# Patient Record
Sex: Male | Born: 1985 | Race: White | Hispanic: No | Marital: Single | Smoking: Never smoker
Health system: Southern US, Community
[De-identification: ages and names within clinical notes are randomized; demographics above are authoritative.]

## PROBLEM LIST (undated history)

## (undated) DIAGNOSIS — R030 Elevated blood-pressure reading, without diagnosis of hypertension: Secondary | ICD-10-CM

## (undated) DIAGNOSIS — J302 Other seasonal allergic rhinitis: Principal | ICD-10-CM

## (undated) HISTORY — DX: Elevated blood-pressure reading, without diagnosis of hypertension: R03.0

## (undated) HISTORY — PX: WISDOM TOOTH EXTRACTION: SHX21

## (undated) HISTORY — DX: Other seasonal allergic rhinitis: J30.2

---

## 2008-03-09 ENCOUNTER — Encounter: Admission: RE | Admit: 2008-03-09 | Discharge: 2008-03-09 | Payer: Self-pay | Admitting: Orthopedic Surgery

## 2011-07-17 ENCOUNTER — Emergency Department
Admit: 2011-07-17 | Discharge: 2011-07-17 | Disposition: A | Discharge status: Home / Self Care | Payer: BC Managed Care – PPO

## 2011-07-17 ENCOUNTER — Emergency Department
Admission: EM | Admit: 2011-07-17 | Discharge: 2011-07-17 | Disposition: A | Discharge status: Home / Self Care | Payer: BC Managed Care – PPO | Source: Home / Self Care | Attending: Family Medicine | Admitting: Family Medicine

## 2011-07-17 ENCOUNTER — Encounter: Payer: Self-pay | Admitting: Emergency Medicine

## 2011-07-17 DIAGNOSIS — S2341XA Sprain of ribs, initial encounter: Secondary | ICD-10-CM

## 2011-07-17 DIAGNOSIS — S299XXA Unspecified injury of thorax, initial encounter: Secondary | ICD-10-CM

## 2011-07-17 MED ORDER — TRAMADOL HCL 50 MG PO TABS: 50.0000 mg | ORAL_TABLET | Freq: Every evening | ORAL | Status: AC | PRN

## 2011-07-17 NOTE — ED Provider Notes (Signed)
History     CSN: [id number] Arrival date & time: 07/17/2011 12:48 PM   First MD Initiated Contact with Patient 07/17/11 1321      Chief Complaint  Patient presents with  . Rib Injury    (Consider location/radiation/quality/duration/timing/severity/associated sxs/prior treatment) HPI Comments: Patient was playing soccer two weeks ago when another player kneed him in the right lateral chest.  He felt a popping sensation and has had persistent pain in right lateral chest with movement and inspiration.  No shortness of breath    History reviewed. No pertinent past medical history.  History reviewed. No pertinent past surgical history.  History reviewed. No pertinent family history.  History  Substance Use Topics  . Smoking status: Never Smoker   . Smokeless tobacco: Not on file  . Alcohol Use: Yes      Review of Systems  Respiratory: Negative for shortness of breath and wheezing.     Allergies  Sulfate  Home Medications   Current Outpatient Rx  Name Route Sig Dispense Refill  . CETIRIZINE HCL 10 MG PO TABS Oral Take 10 mg by mouth daily.      . TRAMADOL HCL 50 MG PO TABS Oral Take 1 tablet (50 mg total) by mouth at bedtime as needed for pain. Maximum dose= 8 tablets per day 15 tablet 0    BP 168/92  Pulse 70  Temp(Src) 98.4 F (36.9 C) (Oral)  Resp 16  Ht 6\' 1"  (1.854 m)  Wt 208 lb (94.348 kg)  BMI 27.44 kg/m2  SpO2 99%  Physical Exam  Nursing note and vitals reviewed. Constitutional: He appears well-developed and well-nourished. No distress.  Neck: Normal range of motion.  Cardiovascular: Normal rate and normal heart sounds.   Pulmonary/Chest: No respiratory distress. He has no wheezes. He has no rales. He exhibits tenderness.  Abdominal: Soft. There is no tenderness.  Musculoskeletal:       Arms:      Tenderness over right lateral anterior inferior ribs.  No swelling, ecchymosis or crepitance    ED Course  Procedures  none   DG Ribs Unilateral  W/Chest Right (Final result)   Result time:07/17/11 1432    Final result by Rad Results In Interface (07/17/11 14:32:53)    Narrative:   *RADIOLOGY REPORT*  Clinical Data: Contusion to right lateral chest soccer 2 weeks ago. Tenderness right lateral ribs.  RIGHT RIBS AND CHEST - 3+ VIEW  Comparison: None.  Findings: Frontal view the chest demonstrates midline trachea. Normal heart size and mediastinal contours. No pleural effusion or pneumothorax.  Clear lungs.  2 views of right-sided ribs demonstrate a radiographic marker over the 11th posterolateral right rib. No displaced fracture.  IMPRESSION: No displaced rib fracture or pneumothorax.  Original Report Authenticated By: Consuello Bossier, M.D.      1. Rib injury       MDM  Rib belt applied.  Wear as needed.  Analgesic Rx for bedtime (tramadol) ([facility] information and instruction handout given)  Followup with [facility] if not improving about two weeks.         Donna Christen, MD 07/17/11 1444

## 2011-07-17 NOTE — ED Notes (Signed)
Rt rib injury 2 weeks ago in a soccer game

## 2013-01-10 ENCOUNTER — Ambulatory Visit (INDEPENDENT_AMBULATORY_CARE_PROVIDER_SITE_OTHER): Payer: BC Managed Care – PPO | Admitting: Family Medicine

## 2013-01-10 ENCOUNTER — Encounter: Payer: Self-pay | Admitting: Family Medicine

## 2013-01-10 VITALS — BP 151/95 | HR 80 | Ht 73.0 in | Wt 199.0 lb

## 2013-01-10 DIAGNOSIS — J309 Allergic rhinitis, unspecified: Secondary | ICD-10-CM

## 2013-01-10 DIAGNOSIS — IMO0001 Reserved for inherently not codable concepts without codable children: Secondary | ICD-10-CM

## 2013-01-10 DIAGNOSIS — J302 Other seasonal allergic rhinitis: Secondary | ICD-10-CM

## 2013-01-10 DIAGNOSIS — R030 Elevated blood-pressure reading, without diagnosis of hypertension: Secondary | ICD-10-CM

## 2013-01-10 HISTORY — DX: Reserved for inherently not codable concepts without codable children: IMO0001

## 2013-01-10 HISTORY — DX: Other seasonal allergic rhinitis: J30.2

## 2013-01-10 MED ORDER — MONTELUKAST SODIUM 10 MG PO TABS: 10.0000 mg | ORAL_TABLET | Freq: Every day | ORAL | Status: DC

## 2013-01-10 MED ORDER — MOMETASONE FUROATE 50 MCG/ACT NA SUSP: NASAL | Status: DC

## 2013-01-10 NOTE — Progress Notes (Signed)
CC: Juan Lynch is a 27 y.o. male is here for Establish Care and Allergies   Subjective: HPI:  Is a 27 year old here to establish care  Patient complains of allergies has been present since his teenage years. Describes these as nasal stuffiness, drainage down the back of the throat, itchy eyes, sneezing. It is present year-round but worse in the fall and spring. Currently it is moderate in severity. He is using Zyrtec D. up to 4 times a day with mild improvement of symptoms. He has used Careers adviser for months in the past without improvement, he has used Claritin for months in the past without improvement, he has used Claritin-D with no help over a month of use. He is currently using an over-the-counter nasal spray without much improvement of symptoms. He does have a history of mild eczema but no asthma . Symptoms seem to be present all hours of the day occasionally interfering with sleep . Nothing makes better or worse other than above .  Review of Systems - General ROS: negative for - chills, fever, night sweats, weight gain or weight loss Ophthalmic ROS: negative for - decreased vision Psychological ROS: negative for - anxiety or depression ENT ROS: negative for - hearing change,  tinnitus  Hematological and Lymphatic ROS: negative for - bleeding problems, bruising or swollen lymph nodes Breast ROS: negative Respiratory ROS: no cough, shortness of breath, or wheezing Cardiovascular ROS: no chest pain or dyspnea on exertion Gastrointestinal ROS: no abdominal pain, change in bowel habits, or black or bloody stools Genito-Urinary ROS: negative for - genital discharge, genital ulcers, incontinence or abnormal bleeding from genitals Musculoskeletal ROS: negative for - joint pain or muscle pain Neurological ROS: negative for - headaches or memory loss Dermatological ROS: negative for lumps, mole changes, rash and skin lesion changes  Past Medical History  Diagnosis Date  . Elevated blood  pressure 01/10/2013  . Seasonal allergies 01/10/2013     Family History  Problem Relation Age of Onset  . Heart disease Maternal Grandfather      History  Substance Use Topics  . Smoking status: Never Smoker   . Smokeless tobacco: Not on file  . Alcohol Use: Yes     Objective: Filed Vitals:   01/10/13 0908  BP: 151/95  Pulse: 80    General: Alert and Oriented, No Acute Distress HEENT: Pupils equal, round, reactive to light. Conjunctivae clear.  External ears unremarkable, canals clear with intact TMs with appropriate landmarks.  Middle ear appears open without effusion. Pale blue inferior turbinates.  Moist mucous membranes, pharynx without inflammation nor lesions however moderate cobblestoning.  Neck supple without palpable lymphadenopathy nor abnormal masses. Lungs: Clear to auscultation bilaterally, no wheezing/ronchi/rales.  Comfortable work of breathing. Good air movement. Cardiac: Regular rate and rhythm. Normal S1/S2.  No murmurs, rubs, nor gallops.   Extremities: No peripheral edema.  Strong peripheral pulses.  Mental Status: No depression, anxiety, nor agitation. Skin: Warm and dry. without rashes  Assessment & Plan: Juan Lynch was seen today for establish care and allergies.  Diagnoses and associated orders for this visit:  Seasonal allergies - montelukast (SINGULAIR) 10 MG tablet; Take 1 tablet (10 mg total) by mouth at bedtime. - mometasone (NASONEX) 50 MCG/ACT nasal spray; Two sprays each nostril daily.  Elevated blood pressure  Chronic allergic rhinitis - montelukast (SINGULAIR) 10 MG tablet; Take 1 tablet (10 mg total) by mouth at bedtime. - mometasone (NASONEX) 50 MCG/ACT nasal spray; Two sprays each nostril daily.  Other Orders - Cetirizine-Pseudoephedrine (  ZYRTEC-D PO); Take by mouth.     Elevated blood pressure: Like him to return in 2-4 weeks after cutting back on salt in the diet, continue to exercise daily as he is already doing. Would like to see,  pseudoephedrine is playing into blood pressure. Seasonal allergies and chronic allergic rhinitis: Uncontrolled chronic condition, use Zyrtec D. only once a day, start Singulair, start Nasonex. If no improvement in 2 weeks will refer to allergist for consideration of immunotherapy  Return if symptoms worsen or fail to improve.

## 2013-06-03 ENCOUNTER — Encounter: Payer: Self-pay | Admitting: Family Medicine

## 2013-06-03 ENCOUNTER — Ambulatory Visit (INDEPENDENT_AMBULATORY_CARE_PROVIDER_SITE_OTHER): Payer: BC Managed Care – PPO | Admitting: Family Medicine

## 2013-06-03 VITALS — BP 151/91 | HR 80 | Wt 194.0 lb

## 2013-06-03 DIAGNOSIS — I10 Essential (primary) hypertension: Secondary | ICD-10-CM

## 2013-06-03 DIAGNOSIS — J302 Other seasonal allergic rhinitis: Secondary | ICD-10-CM

## 2013-06-03 DIAGNOSIS — J309 Allergic rhinitis, unspecified: Secondary | ICD-10-CM

## 2013-06-03 MED ORDER — FLUTICASONE PROPIONATE 50 MCG/ACT NA SUSP: NASAL | Status: DC

## 2013-06-03 MED ORDER — HYDROCHLOROTHIAZIDE 25 MG PO TABS: ORAL_TABLET | ORAL | Status: DC

## 2013-06-03 NOTE — Progress Notes (Signed)
CC: Juan Lynch is a 27 y.o. male is here for Allergies   Subjective: HPI:  singulair no help, nasonex lost effect in september  Followup seasonal allergies: Patient has been taking Nasonex on a daily basis and was noticing nasal congestion and postnasal drip improved from moderate to mild in severity no longer requiring Zyrtec D. he also began Singulair which seem to be helping as well. One month after starting the above regimen he noticed allergies were worsening on a daily basis now back to moderate in severity present on a daily basis present all hours of the day. Symptoms did not worsen or improve after stopping both Singulair and Nasonex. Denies fevers, chills, nausea, wheezing nor shortness of breath  Patient has been off of pseudoephedrine for 24 hours again blood pressure is elevated he has tried to cut salt in his diet. Still exercises most days of the week. Strong family history of essential hypertension. Denies headaches, motor sensory disturbances, chest pain or peripheral edema  Review Of Systems Outlined In HPI  Past Medical History  Diagnosis Date  . Elevated blood pressure 01/10/2013  . Seasonal allergies 01/10/2013     Family History  Problem Relation Age of Onset  . Heart disease Maternal Grandfather      History  Substance Use Topics  . Smoking status: Never Smoker   . Smokeless tobacco: Not on file  . Alcohol Use: Yes     Objective: Filed Vitals:   06/03/13 0958  BP: 151/91  Pulse: 80    General: Alert and Oriented, No Acute Distress HEENT: Pupils equal, round, reactive to light. Conjunctivae clear.  External ears unremarkable, canals clear with intact TMs with appropriate landmarks.  Middle ear appears open without effusion. Boggy inferior turbinates blue hue.  Moist mucous membranes, pharynx without inflammation nor lesions however moderate cobblestoning.  Neck supple without palpable lymphadenopathy nor abnormal masses. Lungs: Clear to auscultation  bilaterally, no wheezing/ronchi/rales.  Comfortable work of breathing. Good air movement. Cardiac: Regular rate and rhythm. Normal S1/S2.  No murmurs, rubs, nor gallops.   Extremities: No peripheral edema.  Strong peripheral pulses.  Mental Status: No depression, anxiety, nor agitation. Skin: Warm and dry.  Assessment & Plan: Juan Lynch was seen today for allergies.  Diagnoses and associated orders for this visit:  Seasonal allergies - fluticasone (FLONASE) 50 MCG/ACT nasal spray; Two sprays each nostril daily.  Essential hypertension, benign - hydrochlorothiazide (HYDRODIURIL) 25 MG tablet; One tablet by mouth every morning for blood pressure control.    Seasonal allergies: Uncontrolled continue Zyrtec D. or just Zyrtec, will try alternative nasal steroid preparations first start Flonase will adjust to formulary if needed Essential hypertension: Discussed the diagnosis with patient start hydrochlorothiazide continue salt restriction infrequent exercise   Return in about 4 weeks (around 07/01/2013).

## 2013-06-15 ENCOUNTER — Emergency Department
Admission: EM | Admit: 2013-06-15 | Discharge: 2013-06-15 | Disposition: A | Discharge status: Home / Self Care | Payer: BC Managed Care – PPO | Source: Home / Self Care | Attending: Family Medicine | Admitting: Family Medicine

## 2013-06-15 ENCOUNTER — Encounter: Payer: Self-pay | Admitting: Emergency Medicine

## 2013-06-15 DIAGNOSIS — S0181XA Laceration without foreign body of other part of head, initial encounter: Secondary | ICD-10-CM

## 2013-06-15 DIAGNOSIS — Z23 Encounter for immunization: Secondary | ICD-10-CM

## 2013-06-15 DIAGNOSIS — S0180XA Unspecified open wound of other part of head, initial encounter: Secondary | ICD-10-CM

## 2013-06-15 MED ORDER — TETANUS-DIPHTH-ACELL PERTUSSIS 5-2.5-18.5 LF-MCG/0.5 IM SUSP
0.5000 mL | Freq: Once | INTRAMUSCULAR | Status: AC
  Administered 2013-06-15: 0.5 mL via INTRAMUSCULAR

## 2013-06-15 MED ORDER — TRAMADOL-ACETAMINOPHEN 37.5-325 MG PO TABS: 1.0000 | ORAL_TABLET | Freq: Four times a day (QID) | ORAL | Status: DC | PRN

## 2013-06-15 NOTE — ED Provider Notes (Signed)
CSN: [id number]     Arrival date & time 06/15/13  1416 History   First MD Initiated Contact with Patient 06/15/13 1425     Chief Complaint  Patient presents with  . Laceration    left side of face    HPI  Face laceration x 1 hour Pt was playing disc golf when he was accidentally struck in the face with disc.  No LOC Minimal bleeding at area of laceration.  Has had mild headache since this point.  Vision intact  Last tdap 2007  Past Medical History  Diagnosis Date  . Elevated blood pressure 01/10/2013  . Seasonal allergies 01/10/2013   No past surgical history on file. Family History  Problem Relation Age of Onset  . Heart disease Maternal Grandfather    History  Substance Use Topics  . Smoking status: Never Smoker   . Smokeless tobacco: Not on file  . Alcohol Use: Yes    Review of Systems  All other systems reviewed and are negative.    Allergies  Sulfate  Home Medications   Current Outpatient Rx  Name  Route  Sig  Dispense  Refill  . Cetirizine-Pseudoephedrine (ZYRTEC-D PO)   Oral   Take by mouth.         . fluticasone (FLONASE) 50 MCG/ACT nasal spray      Two sprays each nostril daily.   16 g   5   . hydrochlorothiazide (HYDRODIURIL) 25 MG tablet      One tablet by mouth every morning for blood pressure control.   30 tablet   2    There were no vitals taken for this visit. Physical Exam  Constitutional: He appears well-developed and well-nourished.  HENT:  Head: Normocephalic.    1 cm linear laceration on L upper cheek Superficial laceration    Eyes: Conjunctivae are normal. Pupils are equal, round, and reactive to light.  Neck: Normal range of motion.  Cardiovascular: Normal rate and regular rhythm.   Pulmonary/Chest: Effort normal and breath sounds normal.  Abdominal: Soft.  Neurological: He is alert.  Skin: Skin is warm.    ED Course  LACERATION REPAIR Date/Time: 06/15/2013 3:07 PM Performed by: Doree Albee Authorized by:  Doree Albee Risks and benefits: risks, benefits and alternatives were discussed Body area: head/neck Location details: left cheek Laceration length: 1 cm Foreign bodies: no foreign bodies Tendon involvement: none Nerve involvement: none Vascular damage: minimal  Anesthesia method: none indicated. Preparation: Patient was prepped and draped in the usual sterile fashion. Irrigation solution: saline Amount of cleaning: standard Debridement: minimal Skin closure: glue Dressing: non-adhesive packing strip Patient tolerance: Patient tolerated the procedure well with no immediate complications.   (including critical care time) Labs Review Labs Reviewed - No data to display Imaging Review No results found.    MDM   1. Facial laceration, initial encounter    Primary closure with dermabond and steri-strips. Avoiding sutures given laceration location.  Tdap given ultracet prn pain.  Discussed general care and infectious/general red flags.  Expect some mild secondary bruising  Follow up as needed.     The patient and/or caregiver has been counseled thoroughly with regard to treatment plan and/or medications prescribed including dosage, schedule, interactions, rationale for use, and possible side effects and they verbalize understanding. Diagnoses and expected course of recovery discussed and will return if not improved as expected or if the condition worsens. Patient and/or caregiver verbalized understanding.          Doree Albee,  MD 06/15/13 1512

## 2013-06-15 NOTE — ED Notes (Signed)
Patient c/o laceration to left side of face was playing disc golf and got hit on face less than 1 hour ago. Last tetanus shot 2007.

## 2013-07-31 ENCOUNTER — Encounter: Payer: Self-pay | Admitting: Family Medicine

## 2013-07-31 ENCOUNTER — Ambulatory Visit (INDEPENDENT_AMBULATORY_CARE_PROVIDER_SITE_OTHER): Payer: BC Managed Care – PPO | Admitting: Family Medicine

## 2013-07-31 VITALS — BP 138/81 | HR 52 | Temp 97.9°F | Wt 194.0 lb

## 2013-07-31 DIAGNOSIS — B9689 Other specified bacterial agents as the cause of diseases classified elsewhere: Secondary | ICD-10-CM

## 2013-07-31 DIAGNOSIS — A499 Bacterial infection, unspecified: Secondary | ICD-10-CM

## 2013-07-31 DIAGNOSIS — J302 Other seasonal allergic rhinitis: Secondary | ICD-10-CM

## 2013-07-31 DIAGNOSIS — I10 Essential (primary) hypertension: Secondary | ICD-10-CM

## 2013-07-31 DIAGNOSIS — J309 Allergic rhinitis, unspecified: Secondary | ICD-10-CM

## 2013-07-31 DIAGNOSIS — J329 Chronic sinusitis, unspecified: Secondary | ICD-10-CM

## 2013-07-31 MED ORDER — AMOXICILLIN-POT CLAVULANATE 500-125 MG PO TABS: ORAL_TABLET | ORAL | Status: AC

## 2013-07-31 MED ORDER — HYDROCHLOROTHIAZIDE 25 MG PO TABS: ORAL_TABLET | ORAL | Status: DC

## 2013-07-31 MED ORDER — FLUTICASONE PROPIONATE 50 MCG/ACT NA SUSP: NASAL | Status: AC

## 2013-07-31 NOTE — Progress Notes (Signed)
CC: Juan Lynch is a 27 y.o. male is here for Cough, Headache, Otalgia and Nasal Congestion   Subjective: HPI:  Complains of one week of worsening facial pressure localized beneath both eyes left greater than right associated with ear pressure nasal congestion nonproductive cough and fatigue. Subjective fever this morning no chills or motor or sensory disturbances. Denies shortness of breath or chest pain. No improvement with over-the-counter cold medicine. Symptoms are worse in the evening.  Followup hypertension: Has been tolerating hydrochlorothiazide without any known side effects denies chest pain, shortness of breath, orthopnea, peripheral edema. No outside blood pressures to report   Review Of Systems Outlined In HPI  Past Medical History  Diagnosis Date  . Elevated blood pressure 01/10/2013  . Seasonal allergies 01/10/2013     Family History  Problem Relation Age of Onset  . Heart disease Maternal Grandfather      History  Substance Use Topics  . Smoking status: Never Smoker   . Smokeless tobacco: Not on file  . Alcohol Use: Yes     Objective: Filed Vitals:   07/31/13 1131  BP: 138/81  Pulse: 52  Temp: 97.9 F (36.6 C)    General: Alert and Oriented, No Acute Distress HEENT: Pupils equal, round, reactive to light. Conjunctivae clear.  External ears unremarkable, canals clear with intact TMs with appropriate landmarks. Bilateral serous middle ear effusion. Pink inferior turbinates.  Moist mucous membranes, pharynx without inflammation nor lesions.  Neck supple without palpable lymphadenopathy nor abnormal masses. Maxillary sinus tenderness Lungs: Clear to auscultation bilaterally, no wheezing/ronchi/rales.  Comfortable work of breathing. Good air movement. Cardiac: Regular rate and rhythm. Normal S1/S2.  No murmurs, rubs, nor gallops.   Mental Status: No depression, anxiety, nor agitation. Skin: Warm and dry.  Assessment & Plan: Red was seen today for cough,  headache, otalgia and nasal congestion.  Diagnoses and associated orders for this visit:  Seasonal allergies - fluticasone (FLONASE) 50 MCG/ACT nasal spray; Two sprays each nostril daily.  Essential hypertension, benign - hydrochlorothiazide (HYDRODIURIL) 25 MG tablet; One tablet by mouth every morning for blood pressure control.  Bacterial sinusitis - amoxicillin-clavulanate (AUGMENTIN) 500-125 MG per tablet; Take one by mouth every 8 hours for ten total days.    Essential hypertension: Controlled continue hydrochlorothiazide Bacterial sinusitis: Start Augmentin and consider nasal saline washes with Alka-Seltzer cold and sinus  Return in about 3 months (around 10/29/2013).

## 2013-10-06 ENCOUNTER — Encounter: Payer: Self-pay | Admitting: Family Medicine

## 2013-10-06 ENCOUNTER — Ambulatory Visit (INDEPENDENT_AMBULATORY_CARE_PROVIDER_SITE_OTHER): Payer: BC Managed Care – PPO | Admitting: Family Medicine

## 2013-10-06 VITALS — BP 151/88 | HR 61 | Temp 97.8°F | Wt 197.0 lb

## 2013-10-06 DIAGNOSIS — J302 Other seasonal allergic rhinitis: Secondary | ICD-10-CM

## 2013-10-06 DIAGNOSIS — J309 Allergic rhinitis, unspecified: Secondary | ICD-10-CM

## 2013-10-06 DIAGNOSIS — J329 Chronic sinusitis, unspecified: Secondary | ICD-10-CM

## 2013-10-06 DIAGNOSIS — B9689 Other specified bacterial agents as the cause of diseases classified elsewhere: Secondary | ICD-10-CM

## 2013-10-06 DIAGNOSIS — A499 Bacterial infection, unspecified: Secondary | ICD-10-CM

## 2013-10-06 MED ORDER — DOXYCYCLINE HYCLATE 100 MG PO TABS: ORAL_TABLET | ORAL | Status: AC

## 2013-10-06 NOTE — Progress Notes (Signed)
CC: Juan Lynch is a 28 y.o. male is here for Sinusitis   Subjective: HPI:  Complains of nasal congestion and sinus pressure localized beneath both eyes that is been present for about a week worse first thing in the morning overall moderate in severity. Slightly improved with pseudoephedrine nothing else makes better or worse. Worsening on a daily basis. Me by fatigue and chills with a nonproductive cough. Denies shortness of breath, wheezing, chest pain, back pain, motor or sensory disturbances. Symptoms are present all hours of the day but not interfering with sleep  Expresses concern that Flonase does not seem to be working as effectively as it did during the first month of use. Ignoring today's chief complaint he still feels that in his normal state of health he continues to have nasal congestion with clear nasal discharge of mild severity all hours of the day worse first thing in the morning with subjective postnasal drip.   Review Of Systems Outlined In HPI  Past Medical History  Diagnosis Date  . Elevated blood pressure 01/10/2013  . Seasonal allergies 01/10/2013    No past surgical history on file. Family History  Problem Relation Age of Onset  . Heart disease Maternal Grandfather     History   Social History  . Marital Status: Single    Spouse Name: N/A    Number of Children: N/A  . Years of Education: N/A   Occupational History  . Not on file.   Social History Main Topics  . Smoking status: Never Smoker   . Smokeless tobacco: Not on file  . Alcohol Use: Yes  . Drug Use: Not on file  . Sexual Activity: Not on file   Other Topics Concern  . Not on file   Social History Narrative  . No narrative on file     Objective: BP 151/88  Pulse 61  Temp(Src) 97.8 F (36.6 C) (Oral)  Wt 197 lb (89.359 kg)  General: Alert and Oriented, No Acute Distress HEENT: Pupils equal, round, reactive to light. Conjunctivae clear.  External ears unremarkable, canals clear with  intact TMs with appropriate landmarks.  Middle ear appears open without effusion. Pink inferior turbinates.  Moist mucous membranes, pharynx without inflammation nor lesions however moderate cobblestoning.  Neck supple without palpable lymphadenopathy nor abnormal masses. Lungs: Clear to auscultation bilaterally, no wheezing/ronchi/rales.  Comfortable work of breathing. Good air movement. Frequent coughing Cardiac: Regular rate and rhythm. Normal S1/S2.  No murmurs, rubs, nor gallops.   Mental Status: No depression, anxiety, nor agitation. Skin: Warm and dry.  Assessment & Plan: Juan Lynch was seen today for sinusitis.  Diagnoses and associated orders for this visit:  Bacterial sinusitis - doxycycline (VIBRA-TABS) 100 MG tablet; One by mouth twice a day for ten days.  Seasonal allergies    Bacterial sinusitis: Start doxycycline try to avoid pseudoephedrine given elevated blood pressures and hypertension strongly consider nasal saline washes. Seasonal allergies: Continue fluticasone if symptoms are not improved after bacterial sinusitis is resolved call and we will change nasal steroid to QNASL  25 minutes spent face-to-face during visit today of which at least 50% was counseling or coordinating care regarding: 1. Bacterial sinusitis   2. Seasonal allergies       Return if symptoms worsen or fail to improve.

## 2013-12-19 ENCOUNTER — Other Ambulatory Visit: Payer: Self-pay | Admitting: Family Medicine

## 2014-01-03 ENCOUNTER — Other Ambulatory Visit: Payer: Self-pay | Admitting: Family Medicine

## 2014-07-13 ENCOUNTER — Telehealth: Payer: Self-pay | Admitting: *Deleted

## 2014-07-13 DIAGNOSIS — J302 Other seasonal allergic rhinitis: Secondary | ICD-10-CM

## 2014-07-13 NOTE — Telephone Encounter (Signed)
Pt would like a referral to an allergy specialist.referral placed

## 2015-02-26 ENCOUNTER — Ambulatory Visit (INDEPENDENT_AMBULATORY_CARE_PROVIDER_SITE_OTHER): Payer: BC Managed Care – PPO

## 2015-02-26 ENCOUNTER — Encounter: Payer: Self-pay | Admitting: Family Medicine

## 2015-02-26 ENCOUNTER — Ambulatory Visit (INDEPENDENT_AMBULATORY_CARE_PROVIDER_SITE_OTHER): Payer: BC Managed Care – PPO | Admitting: Family Medicine

## 2015-02-26 VITALS — BP 157/91 | HR 66 | Wt 181.0 lb

## 2015-02-26 DIAGNOSIS — M238X1 Other internal derangements of right knee: Secondary | ICD-10-CM | POA: Insufficient documentation

## 2015-02-26 DIAGNOSIS — M25561 Pain in right knee: Secondary | ICD-10-CM | POA: Diagnosis not present

## 2015-02-26 MED ORDER — NITROGLYCERIN 0.2 MG/HR TD PT24: MEDICATED_PATCH | TRANSDERMAL | Status: DC

## 2015-02-26 MED ORDER — TRAMADOL HCL 50 MG PO TABS: 50.0000 mg | ORAL_TABLET | Freq: Three times a day (TID) | ORAL | Status: DC | PRN

## 2015-02-26 NOTE — Assessment & Plan Note (Addendum)
  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 Plan to treat with home strengthening exercises, physical therapy, nitroglycerin patch protocol. Follow-up in 3-4 weeks. X-ray pending

## 2015-02-26 NOTE — Progress Notes (Signed)
Juan Lynch is a 29 y.o. male who [facility]  today for right knee pain. Patient is a two-month history of right lateral knee pain. Pain occurred after he misstepped playing soccer and felt a jarring sensation in his knee. He denies any knee swelling. Pain is worse with running and sprinting. He plays soccer recreationally and runs about 12 miles per week. Pain is moderate to severe worse with activity and worse at the end of the day. Pain can be so severe he limps. He's tried ibuprofen which has not helped. No fevers chills nausea vomiting diarrhea radiating pain weakness or numbness.   Past Medical History  Diagnosis Date  . Elevated blood pressure 01/10/2013  . Seasonal allergies 01/10/2013   History reviewed. No pertinent past surgical history. History  Substance Use Topics  . Smoking status: Never Smoker   . Smokeless tobacco: Not on file  . Alcohol Use: Yes   ROS as above Medications: Current Outpatient Prescriptions  Medication Sig Dispense Refill  . cetirizine (ZYRTEC) 10 MG tablet Take 10 mg by mouth daily.    . fluticasone (FLONASE) 50 MCG/ACT nasal spray Two sprays each nostril daily. 16 g 5  . nitroGLYCERIN (NITRODUR - DOSED IN MG/24 HR) 0.2 mg/hr patch 1/4 patch to the area of pain. Change patch daily. Use for insertional tendinopathy 30 patch 1  . traMADol (ULTRAM) 50 MG tablet Take 1 tablet (50 mg total) by mouth every 8 (eight) hours as needed. 30 tablet 0   No current facility-administered medications for this visit.   Allergies  Allergen Reactions  . Sulfate      Exam:  BP 157/91 mmHg  Pulse 66  Wt 181 lb (82.101 kg) Gen: Well NAD HEENT: EOMI,  MMM Lungs: Normal work of breathing. CTABL Heart: RRR no MRG Abd: NABS, Soft. Nondistended, Nontender Exts: Brisk capillary refill, warm and well perfused.  Hips:  Right hip normal-appearing nontender limited external motion normal internal motion. Pain free  motion. Abduction strength 4/5 Left hip normal-appearing nontender limited external motion normal internal motion pain free motion. Abduction strength 5/5  Right knee normal-appearing no effusion. Tender palpation right lateral proximal fibula head. Range of motion 0-120 Nontender joint line Stable ligamentous exam Negative McMurray's test  Intact extension strength. Intact flexion strength but pain at the insertion of the biceps for Morris with resisted flexion  Left knee: Normal-appearing no effusion nontender normal motion normal strength negative ligamentous instability. Negative McMurray's test  Feet are normal appearing with no significant pronation or subluxation. Leg length equal Normal gait    No results found for this or any previous visit (from the past 24 hour(s)). No results found.   Please see individual assessment and plan sections.

## 2015-02-26 NOTE — Patient Instructions (Signed)
Thank you for coming in today. Start the nitroglycerin patches when you return from vacation. Nitroglycerin Protocol   Apply 1/4 nitroglycerin patch to affected area daily.  Change position of patch within the affected area every 24 hours.  You may experience a headache during the first 1-2 weeks of using the patch, these should subside.  If you experience headaches after beginning nitroglycerin patch treatment, you may take your preferred over the counter pain reliever.  Another side effect of the nitroglycerin patch is skin irritation or rash related to patch adhesive.  Please notify our office if you develop more severe headaches or rash, and stop the patch.  Tendon healing with nitroglycerin patch may require 12 to 24 weeks depending on the extent of injury.  Men should not use if taking Viagra, Cialis, or Levitra.   Do not use if you have migraines or rosacea.   Use tramadol sparingly for severe pain. Attend physical therapy Do the stretching exercises were discussed. Return in 3-4 weeks.

## 2015-03-10 ENCOUNTER — Ambulatory Visit (INDEPENDENT_AMBULATORY_CARE_PROVIDER_SITE_OTHER): Payer: BC Managed Care – PPO | Admitting: Physical Therapy

## 2015-03-10 DIAGNOSIS — M2560 Stiffness of unspecified joint, not elsewhere classified: Secondary | ICD-10-CM

## 2015-03-10 DIAGNOSIS — R52 Pain, unspecified: Secondary | ICD-10-CM

## 2015-03-10 DIAGNOSIS — R531 Weakness: Secondary | ICD-10-CM | POA: Diagnosis not present

## 2015-03-10 NOTE — Patient Instructions (Signed)
Quads / HF, Lunge     Do with both legs.   [place] [phone]   Kneel in deep lunge, behind leg on floor. Push pelvis down slowly while slightly arching back until stretch is felt on front of hip. Hold _30__ seconds. Repeat _1__ times per session. Do __1_ sessions per day.  Quads / HF, Prone   Use a strap to pull foot in   Lie face down, knees together. Grasp one ankle with same-side hand. Use towel if needed to reach. Gently pull foot toward buttock. Hold _30__ seconds. Repeat _1__ times per session. Do _1__ sessions per day.   Supine: Leg Stretch with Strap (Super Advanced)   Lie on back with one leg straight. Hook strap around other foot. Straighten knee. Raise leg to maximal stretch and straighten knee further by tightening quadriceps. Slowly press other leg down as close to floor as possible. Keep lower abdominals tight. Hold _30__ seconds. Warning: Intense stretch. Stay within tolerance. Repeat _1__ times per session. Do _1__ sessions per day.  Gastroc Stretch   Stand with right foot back, leg straight, forward leg bent. Keeping heel on floor, turned slightly out, lean into wall until stretch is felt in calf. Hold _30___ seconds. Repeat __1__ times per set. Do __1__ sets per session. Do _1___ sessions per day.  Balance: Unilateral - Forward Lean   Stand on left foot, hands on hips. Keeping hips level, bend forward as if to touch forehead to wall. Hold _1___ seconds. Relax. Repeat __10__ times per set. Do _2-3___ sets per session. Do _1___ sessions per day.  Straight Leg Raise: With External Leg Rotation   Lie on back with right leg straight, opposite leg bent. Rotate straight leg out and lift _10-12___ inches. Repeat __10__ times per set. Do _2-3___ sets per session. Do __1__ sessions per day. http://orth.exer.us/728   Copyright  VHI. All rights reserved.

## 2015-03-10 NOTE — Therapy (Signed)
[facility] [place] [address] [place], [place], [address] Phone: [phone]   Fax:  [phone]  Physical Therapy Evaluation  Patient Details  Name: Juan Lynch MRN: [id number] Date of Birth: [birth year] Referring Provider:  Rodolph Bong, MD  Encounter Date: 03/10/2015      PT End of Session - 03/10/15 0909    Visit Number 1   Number of Visits 6   Date for PT Re-Evaluation 04/21/15   PT Start Time 0759   PT Stop Time 0907   PT Time Calculation (min) 68 min   Activity Tolerance Patient tolerated treatment well      Past Medical History  Diagnosis Date  . Elevated blood pressure 01/10/2013  . Seasonal allergies 01/10/2013    No past surgical history on file.  There were no vitals filed for this visit.  Visit Diagnosis:  Weakness generalized - Plan: PT plan of care cert/re-cert  Stiffness of joints, multiple sites - Plan: PT plan of care cert/re-cert  Pain of multiple sites - Plan: PT plan of care cert/re-cert      Subjective Assessment - 03/10/15 0801    Subjective Pt reports he was playing soccer about 2months ago when he had a pop in his Rt knee with immediate pain. Had planted on the Rt knee and it locked.  Now the pain is to the point where he limps at times.    Pertinent History Pt had knee pain about a yr ago, similiar to this. He had dry needling completed on his hips and this helped him. This resolved his pain. Reports he is unable to tolerate the nitro patches due to HA   Patient Stated Goals return to soccer and running without pain   Currently in Pain? Yes   Pain Location Knee   Pain Orientation Right;Distal;Posterior   Pain Descriptors / Indicators Sharp;Dull   Pain Type Acute pain   Pain Radiating Towards with running down to heel Rt    Pain Frequency Intermittent   Aggravating Factors  running hurt the Rt LE from hip to heel - dull pain   Pain Relieving Factors nothing            [facility] PT  Assessment - 03/10/15 0001    Assessment   Medical Diagnosis Rt knee pain   Onset Date/Surgical Date 01/08/15   Hand Dominance Right   Next MD Visit not scheduled yet   Prior Therapy no   Precautions   Precautions None   Balance Screen   Has the patient fallen in the past 6 months No   Has the patient had a decrease in activity level because of a fear of falling?  Yes  due to knee pain   Is the patient reluctant to leave their home because of a fear of falling?  No   Prior Function   Level of Independence Independent   Vocation Full time employment   Vocation Requirements desk job   Leisure soccer, run, lift [employer]    Observation/Other Assessments   Focus on Therapeutic Outcomes (FOTO)  55% limitations   Posture/Postural Control   Posture Comments visible atrophy of Rt quad,    ROM / Strength   AROM / PROM / Strength AROM;PROM;Strength   AROM   Overall AROM Comments hips/knees WFL   AROM Assessment Site Ankle   Right/Left Ankle Right   Right Ankle Dorsiflexion 5  Lt 12   PROM   PROM Assessment Site Ankle   Right/Left Ankle  Right  DF 15   Strength   Overall Strength Comments bilat hips/knees/ankles WNL with break test  functional weakness with weightbearing activities   Flexibility   Soft Tissue Assessment /Muscle Length yes   Hamstrings bilat tight at ~ 80 degrees   Quadriceps prone quad tight bilat, ~ 8" from buttocks   ITB tight bilat   Palpation   Palpation comment tenderness in Rt bicep femoris insertion site, tight and tender in bilat hip flexors, Rt ITB, Lt hip adductors    Special Tests    Special Tests Knee Special Tests  negative   Balance   Balance Assessed --  single leg stance > 15 sec bilat, inc accessory motion Rt                   [facility] Adult PT Treatment/Exercise - 03/10/15 0001    Exercises   Exercises Knee/Hip;Ankle   Knee/Hip Exercises: Stretches   Passive Hamstring Stretch 2 reps;30 seconds  bilat with strap   Quad Stretch 2  reps;30 seconds  bilat with strap prone   Hip Flexor Stretch Both;2 reps;30 seconds  low lunge   Other Knee/Hip Stretches Lt hip adductor stretch with strap supine   Knee/Hip Exercises: Standing   SLS 2x10 with FWD lean bilat   Knee/Hip Exercises: Supine   Straight Leg Raise with External Rotation Strengthening;Both;2 sets;10 reps  in long sit   Modalities   Modalities Iontophoresis   Iontophoresis   Type of Iontophoresis Dexamethasone   Location Rt bicep femoris insertion   Dose 1.0cc    Time 6 hr patch   Ankle Exercises: Stretches   Gastroc Stretch 2 reps;30 seconds  bilat                PT Education - 03/10/15 0856    Education provided Yes   Education Details HEP   Person(s) Educated Patient   Methods Explanation;Demonstration;Handout   Comprehension Returned demonstration;Verbalized understanding             PT Long Term Goals - 03/10/15 0937    PT LONG TERM GOAL #1   Title I with advanced HEP ( 04/21/15)   Time 6   Period Weeks   Status New   PT LONG TERM GOAL #2   Title run intervals without LE pain ( 04/21/15)    Time 6   Period Weeks   Status New   PT LONG TERM GOAL #3   Title demo bilat LE flexibility  WNL - quads =/< 2" from buttocks, HS 90 degrees, hip flexors WNL ( 04/21/15)   Time 6   Period Weeks   Status New   PT LONG TERM GOAL #4   Title improve FOTO =/< 30% limited ( 04/21/15)   Time 6   Period Weeks   Status New   PT LONG TERM GOAL #5   Title perform core ther ex with strong contraction of mulitfidis and TA ( 04/21/15)   Time 6   Period Weeks   Status New               Plan - 03/10/15 1610    Clinical Impression Statement Pt presents with new onset of  Rt knee pain, he also has a h/o hip and low back injuries over the last couple of years.  These have left him with muscle length and strength imbalances in bliateral LE's . He wishes too increase his activity and train for a 1/2 marathon this fall.  Pt will benefit from  skilled therapeutic intervention in order to improve on the following deficits Pain;Impaired flexibility;Decreased strength   Rehab Potential Excellent   PT Frequency 1x / week   PT Duration 6 weeks   PT Treatment/Interventions Cryotherapy;Electrical Stimulation;Iontophoresis 4mg /ml Dexamethasone;Patient/family education;Functional mobility training;Neuromuscular re-education;Vasopneumatic Device;Manual techniques;Ultrasound;Moist Heat;Therapeutic exercise   PT Next Visit Plan Ultrasound, iont, core - deep    Consulted and Agree with Plan of Care Patient         Problem List Patient Active Problem List   Diagnosis Date Noted  . Right knee pain 02/26/2015  . Essential hypertension, benign 07/31/2013  . Seasonal allergies 01/10/2013    Roderic Scarce, PT 03/10/2015, 9:49 AM  [facility] [address] [place] [place] [address] [place], [place], [address] Phone: [phone]   Fax:  [phone]

## 2015-03-17 ENCOUNTER — Ambulatory Visit (INDEPENDENT_AMBULATORY_CARE_PROVIDER_SITE_OTHER): Payer: BC Managed Care – PPO | Admitting: Physical Therapy

## 2015-03-17 ENCOUNTER — Encounter: Payer: Self-pay | Admitting: Physical Therapy

## 2015-03-17 DIAGNOSIS — R531 Weakness: Secondary | ICD-10-CM | POA: Diagnosis not present

## 2015-03-17 DIAGNOSIS — R52 Pain, unspecified: Secondary | ICD-10-CM

## 2015-03-17 DIAGNOSIS — M2560 Stiffness of unspecified joint, not elsewhere classified: Secondary | ICD-10-CM | POA: Diagnosis not present

## 2015-03-17 NOTE — Patient Instructions (Signed)
Strengthening: Hip Flexion - Resisted      [place] [phone]   With tubing around right ankle, anchor behind, bring leg forward, raising knee up. Slowly return leg back behind body, keeping trunk upright.  Repeat _2-3 x 10__ times per set. Do 1____ sets per session. Do _1___ sessions per day.  Outer Hip Stretch: Reclined IT Band Stretch (Strap)   Strap around opposite foot, pull across only as far as possible with shoulders on mat. Hold for _45_ sec. Repeat __2__ times each leg.  Step-Down: Forward   Tap heel down forward off step, then return to top.  Keep pelvis neutral. Do 2-3 x 10___ times, each leg, _1__ times per day.  Copyright  VHI. All rights reserved.

## 2015-03-17 NOTE — Therapy (Signed)
[place], [place], [address] Phone: [phone]   Fax:  [phone]  Physical Therapy Treatment  Patient Details  Name: Juan Lynch MRN: [id number] Date of Birth: 23-Jun-1986 Referring Provider:  Gregor Hams, MD  Encounter Date: 03/17/2015      PT End of Session - 03/17/15 3818    Visit Number 2   Number of Visits 6   Date for PT Re-Evaluation 04/21/15   PT Start Time 0709   PT Stop Time 0800   PT Time Calculation (min) 51 min      Past Medical History  Diagnosis Date  . Elevated blood pressure 01/10/2013  . Seasonal allergies 01/10/2013    History reviewed. No pertinent past surgical history.  There were no vitals filed for this visit.  Visit Diagnosis:  Weakness generalized  Stiffness of joints, multiple sites  Pain of multiple sites      Subjective Assessment - 03/17/15 0711    Subjective Pt doing well with HEP, his balance is improving, not having any problems with the exercises. No reaction to the patch. Has been able to work out on the elliptical, only had pain on the first day.    Currently in Pain? No/denies            [facility] PT Assessment - 03/17/15 0001    Assessment   Medical Diagnosis Rt knee pain   Onset Date/Surgical Date 01/08/15   Hand Dominance Right   Next MD Visit not scheduled yet   Prior Therapy no   AROM   Right Ankle Dorsiflexion 10   PROM   Right/Left Ankle --  Rt DF 17   Flexibility   Hamstrings WNL   Quadriceps Rt 4" from buttocks, Lt 3"   ITB cont very tight                     [facility] Adult PT Treatment/Exercise - 03/17/15 0001    Knee/Hip Exercises: Stretches   Passive Hamstring Stretch Both;2 reps;30 seconds  with strap   Quad Stretch Both;2 reps;30 seconds  with strap   Other Knee/Hip Stretches ITB stretch, cross body with strap   Knee/Hip Exercises: Aerobic   Nustep L6x5'   Knee/Hip Exercises: Plyometrics   Bilateral Jumping 20 reps  on rebounder, some anterior knee pain   Knee/Hip Exercises: Standing   Step Down Step Height: 4";Step Height: 8";Right  2x10 4" step, 10 reps 8"    Functional Squat --  attempted single Rt on to high mat, pt with difficulty w ali   Other Standing Knee Exercises 10 reps staniding hip flexion, green band eccentric focus   Knee/Hip Exercises: Supine   Bridges Limitations single leg bridging 2x10   Iontophoresis   Type of Iontophoresis Dexamethasone   Location Rt bicep femoris insertion   Dose 1.0cc    Time 6 hr patch   Manual Therapy   Manual Therapy Passive ROM;Joint mobilization   Joint Mobilization Lt hip AP mobs grade II for anterior hip pain   Passive ROM stretching into Lt hip IR increases symptoms, streteched IR and ER and lower quadrant                PT Education - 03/17/15 0740    Education provided Yes   Education Details HEP   Person(s) Educated Patient   Methods Explanation;Handout   Comprehension Returned demonstration;Verbalized understanding             PT  Long Term Goals - 03/17/15 7680    PT LONG TERM GOAL #1   Title I with advanced HEP ( 04/21/15)   Status On-going   PT LONG TERM GOAL #2   Title run intervals without LE pain ( 04/21/15)    Status On-going   PT LONG TERM GOAL #3   Title demo bilat LE flexibility  WNL - quads =/< 2" from buttocks, HS 90 degrees, hip flexors WNL ( 04/21/15)   Status Partially Met   PT LONG TERM GOAL #4   Title improve FOTO =/< 30% limited ( 04/21/15)   Status On-going   PT LONG TERM GOAL #5   Title perform core ther ex with strong contraction of mulitfidis and TA ( 04/21/15)   Status On-going               Plan - 03/17/15 0803    Clinical Impression Statement Second visit for pt, has improved LE flexibility in all directions except ITB.  This is still very tight.  Reports decreased symptoms in Rt knee however Lt hip continues to have some pain.  Progressing towards goals, none met  yet.    Pt will benefit from skilled therapeutic intervention in order to improve on the following deficits Pain;Impaired flexibility;Decreased strength   Rehab Potential Excellent   PT Frequency 1x / week   PT Duration 6 weeks   PT Treatment/Interventions Cryotherapy;Electrical Stimulation;Iontophoresis 8m/ml Dexamethasone;Patient/family education;Functional mobility training;Neuromuscular re-education;Vasopneumatic Device;Manual techniques;Ultrasound;Moist Heat;Therapeutic exercise   PT Next Visit Plan initiate more core   Consulted and Agree with Plan of Care Patient        Problem List Patient Active Problem List   Diagnosis Date Noted  . Right knee pain 02/26/2015  . Essential hypertension, benign 07/31/2013  . Seasonal allergies 01/10/2013    SJeral PinchPT 03/17/2015, 8:06 AM  [facility] [place] [address]Phone: [phone]  Fax:  [phone]

## 2015-03-24 ENCOUNTER — Encounter: Payer: BC Managed Care – PPO | Admitting: Physical Therapy

## 2015-03-31 ENCOUNTER — Encounter: Payer: Self-pay | Admitting: Physical Therapy

## 2015-03-31 ENCOUNTER — Ambulatory Visit (INDEPENDENT_AMBULATORY_CARE_PROVIDER_SITE_OTHER): Payer: BC Managed Care – PPO | Admitting: Physical Therapy

## 2015-03-31 ENCOUNTER — Encounter (INDEPENDENT_AMBULATORY_CARE_PROVIDER_SITE_OTHER): Payer: Self-pay

## 2015-03-31 DIAGNOSIS — R531 Weakness: Secondary | ICD-10-CM

## 2015-03-31 DIAGNOSIS — R52 Pain, unspecified: Secondary | ICD-10-CM

## 2015-03-31 DIAGNOSIS — M2560 Stiffness of unspecified joint, not elsewhere classified: Secondary | ICD-10-CM | POA: Diagnosis not present

## 2015-03-31 NOTE — Therapy (Signed)
[place], [place], [address] Phone: [phone]   Fax:  [phone]  Physical Therapy Treatment  Patient Details  Name: Juan Lynch MRN: [id number] Date of Birth: [birth year] Referring Provider:  Gregor Hams, MD  Encounter Date: 03/31/2015      PT End of Session - 03/31/15 0705    Visit Number 3   Number of Visits 6   Date for PT Re-Evaluation 04/21/15   PT Start Time 0704   PT Stop Time 0751   PT Time Calculation (min) 47 min      Past Medical History  Diagnosis Date  . Elevated blood pressure 01/10/2013  . Seasonal allergies 01/10/2013    History reviewed. No pertinent past surgical history.  There were no vitals filed for this visit.  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 Pt reports 80% improvement   Patient Stated Goals return to soccer and running without pain   Currently in Pain? No/denies  only has post work out stiffness                         [facility] Adult PT Treatment/Exercise - 03/31/15 0001    Knee/Hip Exercises: Stretches   Quad Stretch Both;30 seconds  prone with strap   Other Knee/Hip Stretches ITB in standing   Other Knee/Hip Stretches foam roller for HS and lateral gluts   Knee/Hip Exercises: Aerobic   Elliptical L3 x 5'   Knee/Hip Exercises: Standing   Functional Squat --  25 on upside down BOSU ball   Other Standing Knee Exercises 5x30 sec warrior pose   Knee/Hip Exercises: Supine   Other Supine Knee/Hip Exercises prone pelvic press, then with hip ext and bent knee hip extension    Iontophoresis   Type of Iontophoresis Dexamethasone   Location Rt bicep femoris insertion   Dose 1.0cc    Time 6 hr patch   Ankle Exercises:  Plyometrics   Plyometric Exercises jogging on rebounder with some distal knee pain Rt.    Plyometric Exercises T drills  some lateral Rt knee pain with traveling to the Rt   Plyometric Exercises hopp and sticks                PT Education - 03/31/15 0738    Education provided Yes   Education Details pelvic press series, foam roller   Person(s) Educated Patient   Methods Explanation;Handout   Comprehension Returned demonstration             PT Long Term Goals - 03/31/15 0716    PT LONG TERM GOAL #1   Title I with advanced HEP ( 04/21/15)   Status On-going   PT LONG TERM GOAL #2   Title run intervals without LE pain ( 04/21/15)    Status On-going  pt to attempt running this next week   PT LONG TERM GOAL #3   Title demo bilat LE flexibility  WNL - quads =/< 2" from buttocks, HS 90 degrees, hip flexors WNL ( 04/21/15)   Status Achieved   PT LONG TERM GOAL #4   Title improve FOTO =/< 30% limited ( 04/21/15)   Status On-going   PT LONG TERM GOAL #5  Title perform core ther ex with strong contraction of mulitfidis and TA ( 04/21/15)   Status On-going               Plan - 03/31/15 0756    Clinical Impression Statement Pt reports he is about 80% better, he has met another goal for flexibility.  The pain in the lateral insertion of the Rt HS is still tender however now it is mainly only upon stress.    Pt will benefit from skilled therapeutic intervention in order to improve on the following deficits Pain;Impaired flexibility;Decreased strength   Rehab Potential Excellent   PT Frequency 1x / week   PT Duration 6 weeks   PT Treatment/Interventions Cryotherapy;Electrical Stimulation;Iontophoresis 110m/ml Dexamethasone;Patient/family education;Functional mobility training;Neuromuscular re-education;Vasopneumatic Device;Manual techniques;Ultrasound;Moist Heat;Therapeutic exercise   PT Next Visit Plan progress core and agility   Consulted and Agree with Plan of Care Patient         Problem List Patient Active Problem List   Diagnosis Date Noted  . Right knee pain 02/26/2015  . Essential hypertension, benign 07/31/2013  . Seasonal allergies 01/10/2013    SJeral PinchPT 03/31/2015, 7:58 AM  [facility] [address]Phone: [phone]  Fax:  [phone]

## 2015-03-31 NOTE — Patient Instructions (Signed)
Pelvic Press   [place] [phone]   Place hands under belly between navel and pubic bone, palms up. Feel pressure on hands. Increase pressure on hands by pressing pelvis down. This is NOT a pelvic tilt. Hold _5_ seconds. Relax. Repeat _10__ times.  Leg Lift: One-Leg   Press pelvis down. Keep knee straight; lengthen and lift one leg (from waist). Do not twist body. Keep other leg down. Hold __1_ seconds. Relax. Repeat 10 time. 2-3 sets Repeat with other leg.  Bent Knee Lift (Prone)   Abdomen and head supported,perform pelvic press then bend left knee and slowly raise hip. Avoid arching low back. Repeat _10___ times per set. Do _2-3___ sets per session. Do __1__ sessions per day. Copyright  VHI. All rights reserved.

## 2015-04-07 ENCOUNTER — Encounter: Payer: BC Managed Care – PPO | Admitting: Physical Therapy

## 2015-04-14 ENCOUNTER — Ambulatory Visit (INDEPENDENT_AMBULATORY_CARE_PROVIDER_SITE_OTHER): Payer: BC Managed Care – PPO | Admitting: Physical Therapy

## 2015-04-14 DIAGNOSIS — R52 Pain, unspecified: Secondary | ICD-10-CM

## 2015-04-14 DIAGNOSIS — M2560 Stiffness of unspecified joint, not elsewhere classified: Secondary | ICD-10-CM

## 2015-04-14 DIAGNOSIS — R531 Weakness: Secondary | ICD-10-CM

## 2015-04-14 NOTE — Therapy (Addendum)
[place], [place], [address] Phone: [phone]   Fax:  [phone]  Physical Therapy Treatment  Patient Details  Name: Juan Lynch MRN: [id number] Date of Birth: 1986/06/08 Referring Provider:  Gregor Hams, MD  Encounter Date: 04/14/2015      PT End of Session - 04/14/15 0707    Visit Number 4   Number of Visits 6   Date for PT Re-Evaluation 04/21/15   PT Start Time 0707   PT Stop Time 0804   PT Time Calculation (min) 57 min   Activity Tolerance Patient tolerated treatment well      Past Medical History  Diagnosis Date  . Elevated blood pressure 01/10/2013  . Seasonal allergies 01/10/2013    No past surgical history on file.  There were no vitals filed for this visit.  Visit Diagnosis:  Weakness generalized  Stiffness of joints, multiple sites  Pain of multiple sites      Subjective Assessment - 04/14/15 0708    Subjective Pt reports he has done 5 runs since last visit. Has experienced some Rt knee pain (underneath patella) during running.  Played his first soccer game and had Rt knee pain the next morning.  He feels he may have set himself back a few wks.      Currently in Pain? No/denies  woke up with 2/10 pain in lateral Rt knee, but stretched and it resolved.             [facility] PT Assessment - 04/14/15 0001    Assessment   Medical Diagnosis Rt knee pain   Onset Date/Surgical Date 01/08/15   Hand Dominance Right   Next MD Visit not scheduled yet   Prior Therapy no           [facility] Adult PT Treatment/Exercise - 04/14/15 0001    Knee/Hip Exercises: Stretches   Sports administrator Right;Left;2 reps;30 seconds   Hip Flexor Stretch Left;2 reps;30 seconds  kneeling   Piriformis Stretch Right;Left;30 seconds;2 reps   Other Knee/Hip Stretches ITB in standing 30 sec x 2 reps each leg, 2 x during treatment   Knee/Hip Exercises: Aerobic   Elliptical L3 x 5   Knee/Hip  Exercises: Standing   Step Down Right;1 set;10 reps;Step Height: 8";Hand Hold: 1;Left  heel taps    Functional Squat 5 reps - single leg. Each leg, to black mat table. Challenging   SLS Forward leans to pick up cone x 10 each leg; same exercise repeated with hip flexion once upright x 5 each leg (hand touching ground)   Modalities   Modalities Iontophoresis   Iontophoresis   Type of Iontophoresis Dexamethasone   Location Rt bicep femoris at post fibula   Dose 1.0 cc    Time 6 hr patch    Manual Therapy   Manual Therapy Other (comment)   Other Manual Therapy ice massage to Rt lateral /ant knee x 5 min;  foam roll to Lt ant hip/ ITB.    Ankle Exercises: Plyometrics   Plyometric Exercises T drills  some lateral Rt knee pain with traveling all directions (3 reps, stretched ITB, repeated 2 more times)   Plyometric Exercises hopp and sticks 25 ft x 2                PT Education - 04/14/15 0805    Education provided Yes   Education Details HEP- added single leg squats. Encouraged pt to try using ice massage/ MHP to  area to decrease symptoms.    Person(s) Educated Patient   Methods Explanation;Handout   Comprehension Returned demonstration;Verbalized understanding             PT Long Term Goals - 04/14/15 0800    PT LONG TERM GOAL #1   Title I with advanced HEP ( 04/21/15)   Time 6   Period Weeks   Status On-going   PT LONG TERM GOAL #2   Title run intervals without LE pain ( 04/21/15)    Time 6   Period Weeks   Status On-going  pt ran, but still has Rt knee pain   PT LONG TERM GOAL #3   Title demo bilat LE flexibility  WNL - quads =/< 2" from buttocks, HS 90 degrees, hip flexors WNL ( 04/21/15)   Time 6   Period Weeks   Status Achieved   PT LONG TERM GOAL #4   Title improve FOTO =/< 30% limited ( 04/21/15)   Time 6   Period Weeks   Status On-going   PT LONG TERM GOAL #5   Title perform core ther ex with strong contraction of mulitfidis and TA ( 04/21/15)   Time 6    Period Weeks   Status On-going               Plan - 04/14/15 0756    Clinical Impression Statement Pt tolerated exercises well; reported pain in Rt knee up to 3-4/10 with agility drills. Pt reported stretching, icing, and performing foam roll after exercise calmed LE.  Pt progressing towards goals.   Pt will benefit from skilled therapeutic intervention in order to improve on the following deficits Pain;Impaired flexibility;Decreased strength   Rehab Potential Excellent   PT Frequency 1x / week   PT Duration 6 weeks   PT Treatment/Interventions Cryotherapy;Electrical Stimulation;Iontophoresis 64m/ml Dexamethasone;Patient/family education;Functional mobility training;Neuromuscular re-education;Vasopneumatic Device;Manual techniques;Ultrasound;Moist Heat;Therapeutic exercise   PT Next Visit Plan Continue progressive strengthening to LE and core; assess need for further therapy vs d/c to HEP.    Consulted and Agree with Plan of Care Patient        Problem List Patient Active Problem List   Diagnosis Date Noted  . Right knee pain 02/26/2015  . Essential hypertension, benign 07/31/2013  . Seasonal allergies 01/10/2013    JKerin Perna PTA 04/14/2015 8:26 AM  [place] [address] [address]Phone: [phone]  Fax:  [phone]    PHYSICAL THERAPY DISCHARGE SUMMARY  Visits from Start of Care: 4  Current functional level related to goals / functional outcomes: unknown   Remaining deficits: unknown   Education / Equipment: HEP Plan: Patient agrees to discharge.  Patient goals were partially met. Patient is being discharged due to financial reasons. Pt reports his copay is to high to attend PT, requests d/c.  ?????        SJeral Pinch PT 04/29/2015 12:48 PM

## 2015-04-14 NOTE — Patient Instructions (Signed)
Mini Squat: Single Leg   Stand on right foot. Reach forward for balance and do a mini squat. Keep knees in line with second toe. Knees do not go past toes. Keep knees together. Repeat _5-10__ times. Repeat with other leg for set. Rest. Do 1___ sets per session.   [facility] [address] [place], [place] [address]  [phone] (office) [phone] (fax)

## 2015-04-21 ENCOUNTER — Encounter: Payer: BC Managed Care – PPO | Admitting: Physical Therapy

## 2015-04-28 ENCOUNTER — Encounter: Payer: BC Managed Care – PPO | Admitting: Physical Therapy

## 2016-02-28 ENCOUNTER — Encounter: Payer: Self-pay | Admitting: Sports Medicine

## 2016-02-28 ENCOUNTER — Ambulatory Visit (INDEPENDENT_AMBULATORY_CARE_PROVIDER_SITE_OTHER): Payer: BC Managed Care – PPO | Admitting: Sports Medicine

## 2016-02-28 ENCOUNTER — Ambulatory Visit (INDEPENDENT_AMBULATORY_CARE_PROVIDER_SITE_OTHER): Payer: BC Managed Care – PPO

## 2016-02-28 DIAGNOSIS — M25561 Pain in right knee: Secondary | ICD-10-CM

## 2016-02-28 DIAGNOSIS — M25461 Effusion, right knee: Secondary | ICD-10-CM

## 2016-02-28 NOTE — Progress Notes (Signed)
   Subjective:    I'm seeing this patient as a consultation for:  Dr. 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 Unfortunately more recently he injured his knee again place soccer, had some immediate pain, swelling, pain was on the lateral joint line, swelling happened within a day or 2. At this point he has pain with running, walking, biking, localized to the lateral joint line, moderate, persistent without radiation.  Past medical history, Surgical history, Family history not pertinant except as noted below, Social history, Allergies, and medications have been entered into the medical record, reviewed, and no changes needed.   Review of Systems: No headache, visual changes, nausea, vomiting, diarrhea, constipation, dizziness, abdominal pain, skin rash, fevers, chills, night sweats, weight loss, swollen lymph nodes, body aches, joint swelling, muscle aches, chest pain, shortness of breath, mood changes, visual or auditory hallucinations.   Objective:   General: Well Developed, well nourished, and in no acute distress.  Neuro/Psych: Alert and oriented x3, extra-ocular muscles intact, able to move all 4 extremities, sensation grossly intact. Skin: Warm and dry, no rashes noted.  Respiratory: Not using accessory muscles, speaking in full sentences, trachea midline.  Cardiovascular: Pulses palpable, no extremity edema. Abdomen: Does not appear distended. Right Knee: Visibly swollen with a palpable effusion and fluid wave, and tenderness at the lateral joint line ROM normal in flexion and extension and lower leg rotation. Ligaments with solid consistent endpoints including, PCL, LCL, MCL. I'm unable to appreciate an anterior cruciate ligament in point with the Lachman's test. McMurray's test is positive with pain on terminal flexion, localized at the lateral joint  line, no palpable clicks. Non painful patellar compression. Patellar and quadriceps tendons unremarkable. Hamstring and quadriceps strength is normal.  Impression and Recommendations:   This case required medical decision making of moderate complexity.

## 2016-02-28 NOTE — Assessment & Plan Note (Signed)
At this point I have a concern for anterior cruciate ligament tear and lateral meniscal injury. There is an effusion which is consistent with internal derangement. X-rays have been overall unrevealing, he has failed a year of physician directed conservative measures. Treatment will depend on results of the MRI.

## 2016-03-02 ENCOUNTER — Ambulatory Visit (INDEPENDENT_AMBULATORY_CARE_PROVIDER_SITE_OTHER): Payer: BC Managed Care – PPO | Admitting: Sports Medicine

## 2016-03-02 ENCOUNTER — Encounter: Payer: Self-pay | Admitting: Sports Medicine

## 2016-03-02 DIAGNOSIS — M238X1 Other internal derangements of right knee: Secondary | ICD-10-CM

## 2016-03-02 DIAGNOSIS — M2391 Unspecified internal derangement of right knee: Secondary | ICD-10-CM | POA: Diagnosis not present

## 2016-03-02 MED ORDER — MELOXICAM 15 MG PO TABS: ORAL_TABLET | ORAL | Status: DC

## 2016-03-02 NOTE — Progress Notes (Signed)
  Subjective:    CC: Follow-up MRI  HPI: This is a pleasant 30 year old male, I recently saw him with evidence of internal derangement of the right knee. We obtained an MRI the results of which will be dictated below, he does recall a single injury some time ago. Since then he has had pain over the lateral femoral condyle, swelling. No mechanical symptoms. Injury occurred over a month ago.  Past medical history, Surgical history, Family history not pertinant except as noted below, Social history, Allergies, and medications have been entered into the medical record, reviewed, and no changes needed.   Review of Systems: No fevers, chills, night sweats, weight loss, chest pain, or shortness of breath.   Objective:    General: Well Developed, well nourished, and in no acute distress.  Neuro: Alert and oriented x3, extra-ocular muscles intact, sensation grossly intact.  HEENT: Normocephalic, atraumatic, pupils equal round reactive to light, neck supple, no masses, no lymphadenopathy, thyroid nonpalpable.  Skin: Warm and dry, no rashes. Cardiac: Regular rate and rhythm, no murmurs rubs or gallops, no lower extremity edema.  Respiratory: Clear to auscultation bilaterally. Not using accessory muscles, speaking in full sentences.  MRI shows what appears to be a chondral defect at the lateral femoral condyle.  Procedure: Real-time Ultrasound Guided Injection of right knee Device: GE Logiq E  Verbal informed consent obtained.  Time-out conducted.  Noted no overlying erythema, induration, or other signs of local infection.  Skin prepped in a sterile fashion.  Local anesthesia: Topical Ethyl chloride.  With sterile technique and under real time ultrasound guidance:  1 mL kenalog 40, 2 mL lidocaine, 2 mL Marcaine injected easily. Completed without difficulty  Pain immediately resolved suggesting accurate placement of the medication.  Advised to call if fevers/chills, erythema, induration, drainage,  or persistent bleeding.  Images permanently stored and available for review in the ultrasound unit.  Impression: Technically successful ultrasound guided injection.  Impression and Recommendations:    I spent 25 minutes with this patient, greater than 50% was face-to-face time counseling regarding the above diagnoses, this time was separate from the time spent performing the injection.

## 2016-03-02 NOTE — Assessment & Plan Note (Signed)
MRI shows fairly large chondral defect on the lateral femoral condyle. This is likely traumatic. Meloxicam, injection, reaction knee brace, home rehabilitation exercises, return in one month, referral for drilling/microfracture if no better.

## 2016-03-31 ENCOUNTER — Other Ambulatory Visit: Payer: Self-pay

## 2016-03-31 DIAGNOSIS — M238X1 Other internal derangements of right knee: Secondary | ICD-10-CM

## 2016-03-31 MED ORDER — MELOXICAM 15 MG PO TABS: ORAL_TABLET | ORAL | 0 refills | Status: DC

## 2016-04-11 ENCOUNTER — Emergency Department
Admission: EM | Admit: 2016-04-11 | Discharge: 2016-04-11 | Disposition: A | Discharge status: Home / Self Care | Payer: BC Managed Care – PPO | Source: Home / Self Care | Attending: Family Medicine | Admitting: Family Medicine

## 2016-04-11 ENCOUNTER — Encounter: Payer: Self-pay | Admitting: *Deleted

## 2016-04-11 DIAGNOSIS — M26621 Arthralgia of right temporomandibular joint: Secondary | ICD-10-CM

## 2016-04-11 DIAGNOSIS — H9201 Otalgia, right ear: Secondary | ICD-10-CM

## 2016-04-11 MED ORDER — PREDNISONE 20 MG PO TABS: 20.0000 mg | ORAL_TABLET | Freq: Two times a day (BID) | ORAL | 0 refills | Status: DC

## 2016-04-11 NOTE — Discharge Instructions (Signed)
Apply ice pack to right TMJ for 20 minutes, 3 to 4 times daily  Continue until pain decreases.  Avoid chewy foods.  Consider dental evaluation.

## 2016-04-11 NOTE — ED Triage Notes (Signed)
Pt c/o RT ear/jaw pain x 1 wk after swimming. Denies fever.

## 2016-04-11 NOTE — ED Provider Notes (Signed)
[name]    CSN: [id number] Arrival date & time: 04/11/16  1757  First Provider Contact:  First MD Initiated Contact with Patient 04/11/16 1816        History   Chief Complaint Chief Complaint  Patient presents with  . Otalgia    HPI Juan Lynch is a 30 y.o. male.   Patient complains of right ear ache for about one week after he had been swimming.  No other symptoms.   The history is provided by the patient.  Otalgia  Location:  Right Behind ear:  No abnormality Quality:  Aching Severity:  Mild Onset quality:  Gradual Duration:  1 week Timing:  Intermittent Progression:  Unchanged Chronicity:  New Context: water in ear   Context: not direct blow, not elevation change, not foreign body in ear, not loud noise and not recent URI   Relieved by:  Nothing Worsened by:  Nothing Ineffective treatments:  None tried Associated symptoms: no congestion, no ear discharge, no fever, no headaches, no hearing loss, no neck pain, no rash, no rhinorrhea, no sore throat and no tinnitus     Past Medical History:  Diagnosis Date  . Elevated blood pressure 01/10/2013  . Seasonal allergies 01/10/2013    Patient Active Problem List   Diagnosis Date Noted  . 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 Essential hypertension, benign 07/31/2013  . Seasonal allergies 01/10/2013    Past Surgical History:  Procedure Laterality Date  . WISDOM TOOTH EXTRACTION         Home Medications    Prior to Admission medications   Medication Sig Start Date End Date Taking? Authorizing Provider  cetirizine (ZYRTEC) 10 MG tablet Take 10 mg by mouth daily.    Historical Provider, MD  fluticasone (FLONASE) 50 MCG/ACT nasal spray Two sprays each nostril daily. 07/31/13   Laren Boom, DO  meloxicam (MOBIC) 15 MG tablet One tab PO qAM with breakfast for 2 weeks, then daily prn pain. 03/31/16   Monica Becton, MD  predniSONE (DELTASONE) 20 MG tablet Take 1 tablet  (20 mg total) by mouth 2 (two) times daily. Take with food. 04/11/16   Lattie Haw, MD    Family History Family History  Problem Relation Age of Onset  . Heart disease Maternal Grandfather     Social History Social History  Substance Use Topics  . Smoking status: Never Smoker  . Smokeless tobacco: Never Used  . Alcohol use Yes     Allergies   Sulfate   Review of Systems Review of Systems  Constitutional: Negative for fever.  HENT: Positive for ear pain. Negative for congestion, ear discharge, hearing loss, rhinorrhea, sore throat and tinnitus.   Musculoskeletal: Negative for neck pain.  Skin: Negative for rash.  Neurological: Negative for headaches.  All other systems reviewed and are negative.    Physical Exam Triage Vital Signs ED Triage Vitals  Enc Vitals Group     BP 04/11/16 1814 158/97     Pulse Rate 04/11/16 1814 (!) 56     Resp 04/11/16 1814 16     Temp 04/11/16 1814 98 F (36.7 C)     Temp Source 04/11/16 1814 Oral     SpO2 04/11/16 1814 99 %     Weight 04/11/16 1815 193 lb (87.5 kg)     Height 04/11/16 1815 6' (1.829 m)     Head Circumference --      Peak Flow --  Pain Score 04/11/16 1816 3     Pain Loc --      Pain Edu? --      Excl. in GC? --    No data found.   Updated Vital Signs BP 158/97 (BP Location: Left Arm)   Pulse (!) 56   Temp 98 F (36.7 C) (Oral)   Resp 16   Ht 6' (1.829 m)   Wt 193 lb (87.5 kg)   SpO2 99%   BMI 26.18 kg/m   Visual Acuity Right Eye Distance:   Left Eye Distance:   Bilateral Distance:    Right Eye Near:   Left Eye Near:    Bilateral Near:     Physical Exam Nursing notes and Vital Signs reviewed. Appearance:  Patient appears stated age, and in no acute distress Eyes:  Pupils are equal, round, and reactive to light and accomodation.  Extraocular movement is intact.  Conjunctivae are not inflamed  Ears:  Canals normal.  Tympanic membranes normal.  No tenderness with insertion of speculum into  right canal.  There is distinct tenderness over the right temporomandibular joint.  Palpation there recreates his pain.    Nose:  Normal turbinates.  No sinus tenderness.     Pharynx:  Normal Neck:  Supple.  Skin:  No rash present.    UC Treatments / Results  Labs (all labs ordered are listed, but only abnormal results are displayed) Labs Reviewed -   Tympanometry:  Right ear tympanogram positive peak pressure; Left ear tympanogram positive peak pressure. EKG  EKG Interpretation None       Radiology No results found.  Procedures Procedures (including critical care time)  Medications Ordered in UC Medications - No data to display   Initial Impression / Assessment and Plan / UC Course  I have reviewed the triage vital signs and the nursing notes.  Pertinent labs & imaging results that were available during my care of the patient were reviewed by me and considered in my medical decision making (see chart for details).  Clinical Course  No evidence otitis media or externa.  Begin prednisone burst. Apply ice pack to right TMJ for 20 minutes, 3 to 4 times daily  Continue until pain decreases.  Avoid chewy foods.  Consider dental evaluation. Followup with ENT if not improved 2 weeks.     Final Clinical Impressions(s) / UC Diagnoses   Final diagnoses:  Otalgia of right ear  Arthralgia of right temporomandibular joint    New Prescriptions New Prescriptions   PREDNISONE (DELTASONE) 20 MG TABLET    Take 1 tablet (20 mg total) by mouth 2 (two) times daily. Take with food.     Lattie HawStephen A Beese, MD 04/23/16 [id number]

## 2016-04-12 ENCOUNTER — Telehealth: Payer: Self-pay | Admitting: *Deleted

## 2016-04-12 NOTE — Telephone Encounter (Signed)
Encounter created to enter tympanometry Order and result not entered on DOS.   

## 2016-08-12 ENCOUNTER — Other Ambulatory Visit: Payer: Self-pay | Admitting: Sports Medicine

## 2016-08-12 DIAGNOSIS — M238X1 Other internal derangements of right knee: Secondary | ICD-10-CM

## 2016-09-14 IMAGING — CR DG KNEE COMPLETE 4+V*R*
4 series · 4 of 4 positions shown · non-contrast
Comparison: None.

CLINICAL DATA: Soccer injury 2 months ago with persistent lateral
knee pain.

EXAM:
RIGHT KNEE - COMPLETE 4+ VIEW

[knee ap]
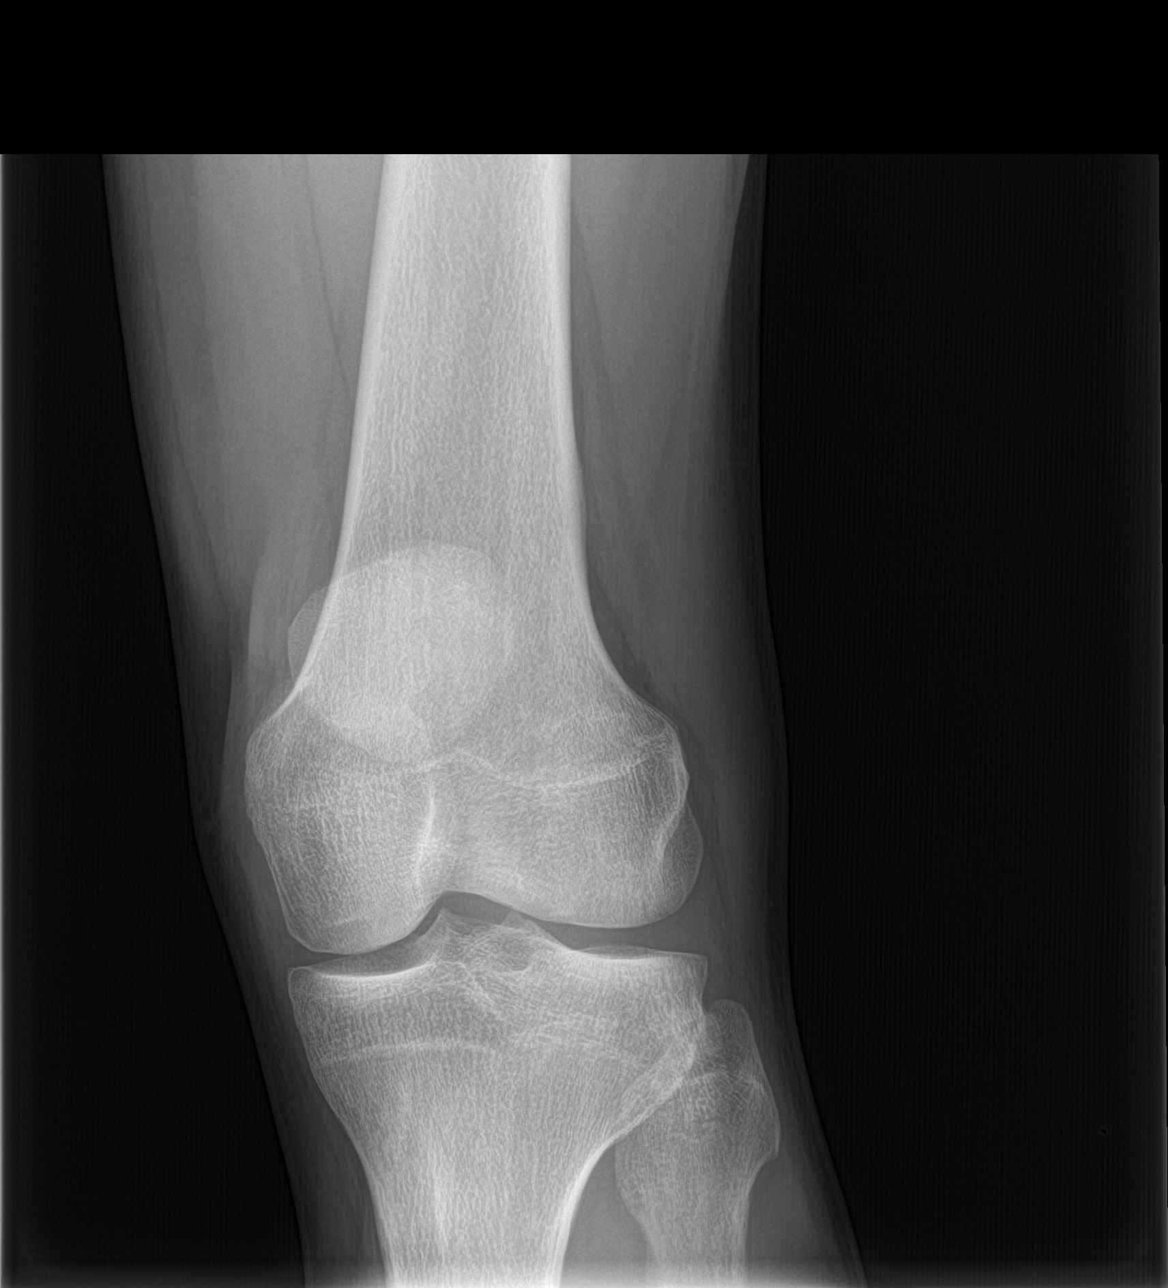

[tunnel]
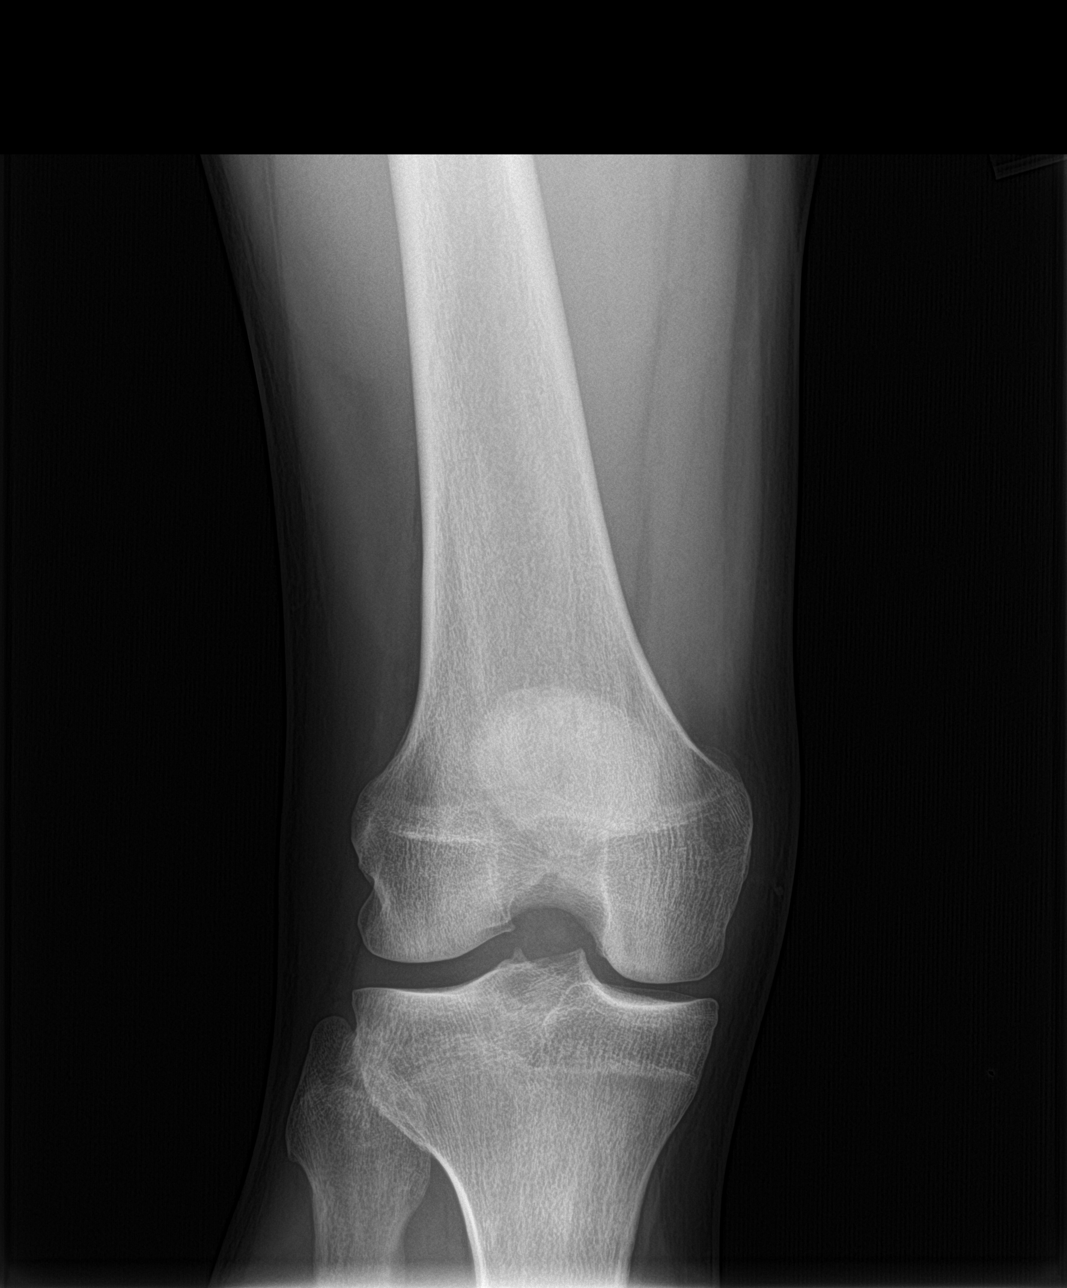

[knee lat]
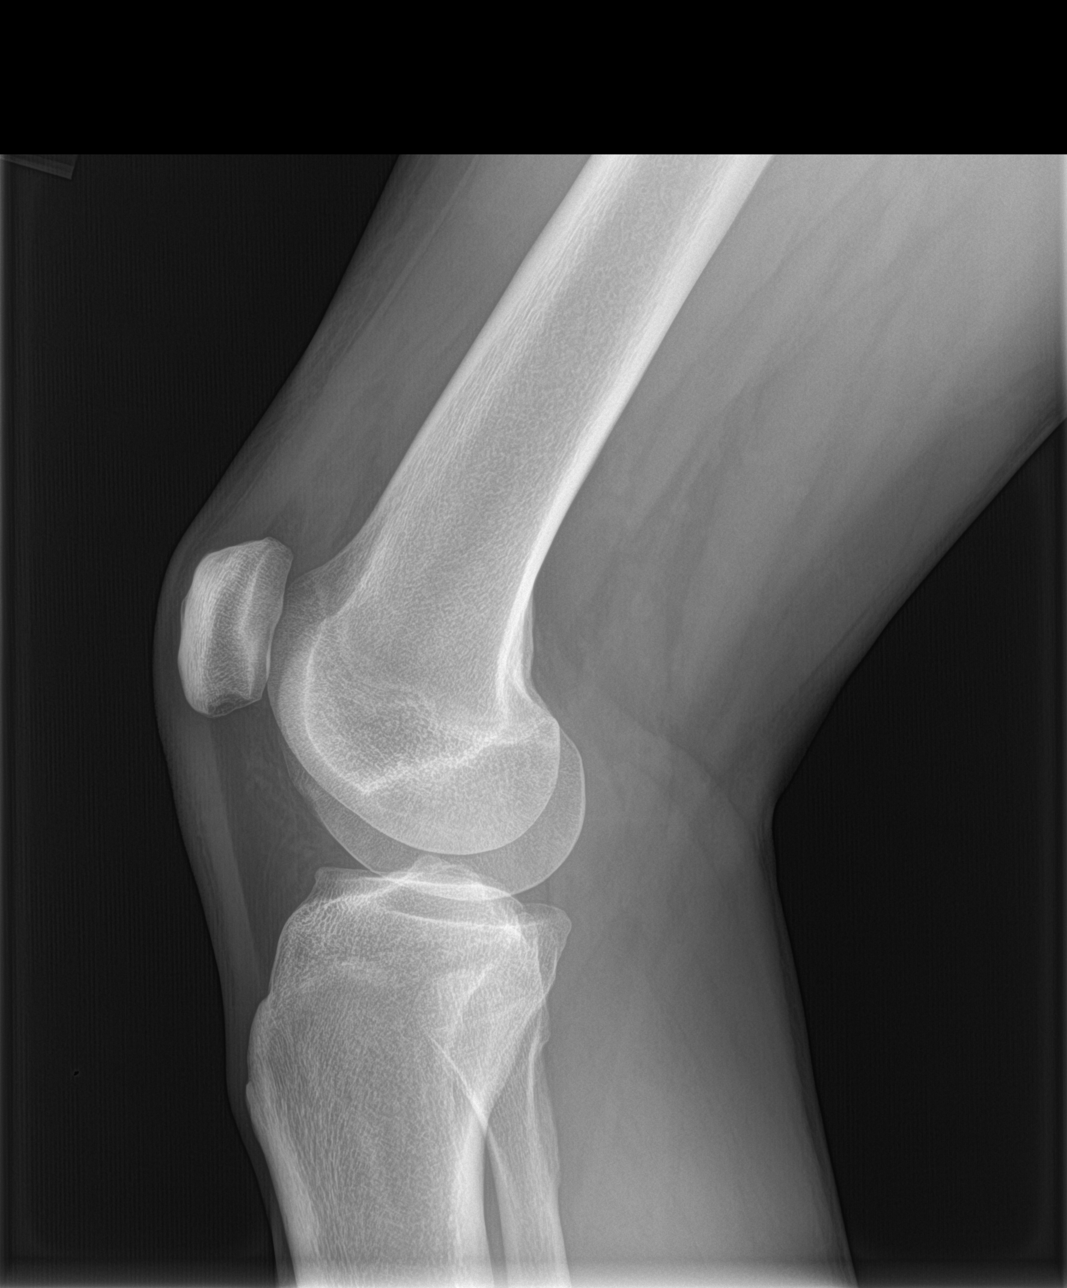

[knee sunrise]
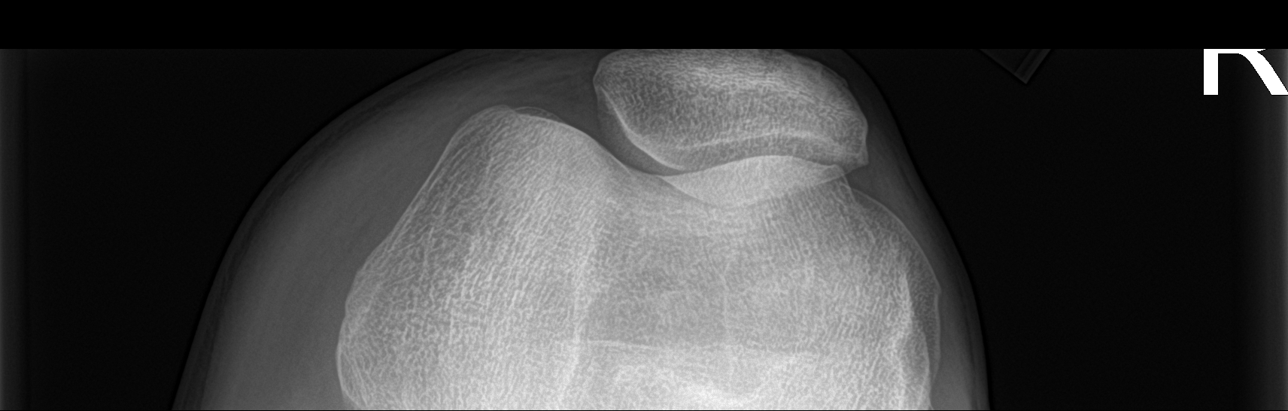

[4 of 4 positions shown; findings below may reference images not displayed]

FINDINGS: The bones of the knee are adequately mineralized. There is minimal
beaking of the tibial spines. There is also minimal narrowing of the
medial joint compartment. There is no chondrocalcinosis. There is no
lytic or blastic lesion. There is no periosteal reaction. The
proximal fibula exhibits exhibits cortical irregularity involving
the metadiaphysis on one view only. The patella appears somewhat
laterally positioned within the with respect to the patellar notch
of the distal femur.
IMPRESSION: 1. Mild degenerative change of the right knee without evidence of
acute or healing fracture or joint effusion. Further evaluation with
MRI may be useful.
2. Cortical irregularity of the meta diaphysis of the proximal
fibula at the margin of the study. Correlation with any symptoms
here is needed.

## 2017-02-19 ENCOUNTER — Encounter: Payer: Self-pay | Admitting: Sports Medicine

## 2017-02-19 ENCOUNTER — Ambulatory Visit (INDEPENDENT_AMBULATORY_CARE_PROVIDER_SITE_OTHER): Payer: BLUE CROSS/BLUE SHIELD | Admitting: Sports Medicine

## 2017-02-19 DIAGNOSIS — M238X1 Other internal derangements of right knee: Secondary | ICD-10-CM | POA: Diagnosis not present

## 2017-02-19 NOTE — Assessment & Plan Note (Signed)
One year response to previous injection, repeat today. If insufficient relief, we will refer for microfracture and drilling.

## 2017-02-19 NOTE — Progress Notes (Signed)
  Subjective:    CC: Follow-up  HPI: Juan Lynch is a pleasant 31 year old male, he has a moderate sized osteochondral defect of his femoral condyle, we injected his knee a year ago and did extremely well. He does not desire to proceed with orthopedic referral for consideration of microfracture and drilling and would like another injection today in the hopes of getting another year of relief. Pain is mild, persistent, localized without radiation. No mechanical symptoms.  Past medical history:  Negative.  See flowsheet/record as well for more information.  Surgical history: Negative.  See flowsheet/record as well for more information.  Family history: Negative.  See flowsheet/record as well for more information.  Social history: Negative.  See flowsheet/record as well for more information.  Allergies, and medications have been entered into the medical record, reviewed, and no changes needed.   Review of Systems: No fevers, chills, night sweats, weight loss, chest pain, or shortness of breath.   Objective:    General: Well Developed, well nourished, and in no acute distress.  Neuro: Alert and oriented x3, extra-ocular muscles intact, sensation grossly intact.  HEENT: Normocephalic, atraumatic, pupils equal round reactive to light, neck supple, no masses, no lymphadenopathy, thyroid nonpalpable.  Skin: Warm and dry, no rashes. Cardiac: Regular rate and rhythm, no murmurs rubs or gallops, no lower extremity edema.  Respiratory: Clear to auscultation bilaterally. Not using accessory muscles, speaking in full sentences. Right Knee: Normal to inspection with no erythema or effusion or obvious bony abnormalities. Palpation normal with no warmth or joint line tenderness or patellar tenderness or condyle tenderness. ROM normal in flexion and extension and lower leg rotation. Ligaments with solid consistent endpoints including ACL, PCL, LCL, MCL. Negative Mcmurray's and provocative meniscal tests. Non  painful patellar compression. Patellar and quadriceps tendons unremarkable. Hamstring and quadriceps strength is normal.  Procedure: Real-time Ultrasound Guided Injection of right knee Device: GE Logiq E  Verbal informed consent obtained.  Time-out conducted.  Noted no overlying erythema, induration, or other signs of local infection.  Skin prepped in a sterile fashion.  Local anesthesia: Topical Ethyl chloride.  With sterile technique and under real time ultrasound guidance:  Using a lateral parapatellar approach I guided the needle into the synovial space and injected 1 mL kenalog 40, 2 mL lidocaine, 2 mL bupivacaine. Completed without difficulty  Pain immediately resolved suggesting accurate placement of the medication.  Advised to call if fevers/chills, erythema, induration, drainage, or persistent bleeding.  Images permanently stored and available for review in the ultrasound unit.  Impression: Technically successful ultrasound guided injection.  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 If insufficient relief, we will refer for microfracture and drilling.

## 2017-08-06 ENCOUNTER — Encounter: Payer: Self-pay | Admitting: Emergency Medicine

## 2017-08-06 ENCOUNTER — Emergency Department
Admission: EM | Admit: 2017-08-06 | Discharge: 2017-08-06 | Disposition: A | Discharge status: Home / Self Care | Payer: BLUE CROSS/BLUE SHIELD | Source: Home / Self Care | Attending: Family Medicine | Admitting: Family Medicine

## 2017-08-06 DIAGNOSIS — J069 Acute upper respiratory infection, unspecified: Secondary | ICD-10-CM

## 2017-08-06 DIAGNOSIS — R053 Chronic cough: Secondary | ICD-10-CM

## 2017-08-06 DIAGNOSIS — R05 Cough: Secondary | ICD-10-CM

## 2017-08-06 MED ORDER — AMOXICILLIN-POT CLAVULANATE 875-125 MG PO TABS: 1.0000 | ORAL_TABLET | Freq: Two times a day (BID) | ORAL | 0 refills | Status: AC

## 2017-08-06 MED ORDER — BENZONATATE 100 MG PO CAPS: 100.0000 mg | ORAL_CAPSULE | Freq: Three times a day (TID) | ORAL | 0 refills | Status: AC

## 2017-08-06 NOTE — ED Provider Notes (Signed)
[facility]    CSN: [id number] Arrival date & time: 08/06/17  [id number]     History   Chief Complaint Chief Complaint  Patient presents with  . URI    HPI Juan Lynch is a 31 y.o. male.   HPI Juan Lynch is a 31 y.o. male presenting to UC with c/o chest congestion, productive cough, and sore throat for about 1 month. Associated Left ear pain and rhinorrhea. He states symptoms would gradually improve, then worsen and have not resoled within 1 month. He is concerned he has a bacterial infection at this time. He has tried OTC Mucinex, guaifenesin, sudafed, and ibuprofen with mild temporary relief. Denies known fever. No known sick contacts. Denies n/v/d.     Past Medical History:  Diagnosis Date  . Elevated blood pressure 01/10/2013  . Seasonal allergies 01/10/2013    Patient Active Problem List   Diagnosis Date Noted  . 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 Essential hypertension, benign 07/31/2013  . Seasonal allergies 01/10/2013    Past Surgical History:  Procedure Laterality Date  . WISDOM TOOTH EXTRACTION         Home Medications    Prior to Admission medications   Medication Sig Start Date End Date Taking? Authorizing Provider  LOSARTAN POTASSIUM PO Take by mouth.   Yes [provider]  amoxicillin-clavulanate (AUGMENTIN) 875-125 MG tablet Take 1 tablet by mouth 2 (two) times daily. One po bid x 7 days 08/06/17   Lurene ShadowPhelps, Chee Kinslow O, PA-C  benzonatate (TESSALON) 100 MG capsule Take 1-2 capsules (100-200 mg total) by mouth every 8 (eight) hours. 08/06/17   Lurene ShadowPhelps, Floreen Teegarden O, PA-C  cetirizine (ZYRTEC) 10 MG tablet Take 10 mg by mouth daily.    [provider]  fluticasone (FLONASE) 50 MCG/ACT nasal spray Two sprays each nostril daily. 07/31/13   Laren BoomHommel, Sean, DO    Family History Family History  Problem Relation Age of Onset  . Heart disease Maternal Grandfather     Social History Social History   Tobacco Use  .  Smoking status: Never Smoker  . Smokeless tobacco: Never Used  Substance Use Topics  . Alcohol use: Yes  . Drug use: No     Allergies   Sulfate   Review of Systems Review of Systems  Constitutional: Negative for chills and fever.  HENT: Positive for congestion, ear pain (Left), postnasal drip, rhinorrhea and sore throat. Negative for trouble swallowing and voice change.   Respiratory: Positive for cough. Negative for shortness of breath.   Cardiovascular: Negative for chest pain and palpitations.  Gastrointestinal: Negative for abdominal pain, diarrhea, nausea and vomiting.  Musculoskeletal: Negative for arthralgias, back pain and myalgias.  Skin: Negative for rash.  Neurological: Positive for headaches. Negative for dizziness and light-headedness.     Physical Exam Triage Vital Signs ED Triage Vitals  Enc Vitals Group     BP 08/06/17 0943 (!) 148/87     Pulse Rate 08/06/17 0943 63     Resp --      Temp 08/06/17 0943 98.4 F (36.9 C)     Temp Source 08/06/17 0943 Oral     SpO2 08/06/17 0943 99 %     Weight 08/06/17 0943 205 lb 12 oz (93.3 kg)     Height 08/06/17 0943 6\' 1"  (1.854 m)     Head Circumference --      Peak Flow --      Pain Score 08/06/17 0944 5  Pain Loc --      Pain Edu? --      Excl. in GC? --    No data found.  Updated Vital Signs BP (!) 148/87 (BP Location: Right Arm)   Pulse 63   Temp 98.4 F (36.9 C) (Oral)   Ht 6\' 1"  (1.854 m)   Wt 205 lb 12 oz (93.3 kg)   SpO2 99%   BMI 27.15 kg/m   Visual Acuity Right Eye Distance:   Left Eye Distance:   Bilateral Distance:    Right Eye Near:   Left Eye Near:    Bilateral Near:     Physical Exam  Constitutional: He is oriented to person, place, and time. He appears well-developed and well-nourished. No distress.  HENT:  Head: Normocephalic and atraumatic.  Right Ear: Tympanic membrane normal.  Left Ear: Tympanic membrane normal.  Nose: Mucosal edema present. Right sinus exhibits no  maxillary sinus tenderness and no frontal sinus tenderness. Left sinus exhibits no maxillary sinus tenderness and no frontal sinus tenderness.  Mouth/Throat: Uvula is midline, oropharynx is clear and moist and mucous membranes are normal.  Eyes: EOM are normal.  Neck: Normal range of motion. Neck supple.  Cardiovascular: Normal rate and regular rhythm.  Pulmonary/Chest: Effort normal and breath sounds normal. No stridor. No respiratory distress. He has no wheezes. He has no rales.  Musculoskeletal: Normal range of motion.  Lymphadenopathy:    He has no cervical adenopathy.  Neurological: He is alert and oriented to person, place, and time.  Skin: Skin is warm and dry. He is not diaphoretic.  Psychiatric: He has a normal mood and affect. His behavior is normal.  Nursing note and vitals reviewed.    UC Treatments / Results  Labs (all labs ordered are listed, but only abnormal results are displayed) Labs Reviewed - No data to display  EKG  EKG Interpretation None       Radiology No results found.  Procedures Procedures (including critical care time)  Medications Ordered in UC Medications - No data to display   Initial Impression / Assessment and Plan / UC Course  I have reviewed the triage vital signs and the nursing notes.  Pertinent labs & imaging results that were available during my care of the patient were reviewed by me and considered in my medical decision making (see chart for details).    Vitals: WNL and lungs clear, however, due to duration of symptoms, will cover for underlying bacterial infection.  Encouraged fluids, rest, sinus rinses. Take antibiotic as prescribed.  F/u with PCP in 1 week if not improving.  Final Clinical Impressions(s) / UC Diagnoses   Final diagnoses:  Upper respiratory tract infection, unspecified type  Persistent cough for 3 weeks or longer    ED Discharge Orders        Ordered    amoxicillin-clavulanate (AUGMENTIN) 875-125 MG  tablet  2 times daily     08/06/17 1029    benzonatate (TESSALON) 100 MG capsule  Every 8 hours     08/06/17 1029       Controlled Substance Prescriptions [place] Controlled Substance Registry consulted? Not Applicable   Rolla Platehelps, Karlena Luebke O, PA-C 08/06/17 1113

## 2017-08-06 NOTE — ED Triage Notes (Signed)
Patient complaining of chest congestion, productive cough, sore throat x 1 month, left ear pain, runny nose.  Patient has been taking Mucinex, Guanfensin, Sudafed and Ibuprofen.

## 2017-08-10 ENCOUNTER — Telehealth: Payer: Self-pay

## 2017-08-10 NOTE — Telephone Encounter (Signed)
Spoke with patient.  Feeling much better.
